# Patient Record
Sex: Male | Born: 1955
Health system: Southern US, Community
[De-identification: ages and names within clinical notes are randomized; demographics above are authoritative.]

---

## 1999-08-10 ENCOUNTER — Encounter: Payer: Self-pay | Admitting: Emergency Medicine

## 1999-08-10 ENCOUNTER — Emergency Department (HOSPITAL_COMMUNITY): Admission: EM | Admit: 1999-08-10 | Discharge: 1999-08-10 | Payer: Self-pay | Admitting: Emergency Medicine

## 2012-01-02 ENCOUNTER — Other Ambulatory Visit (HOSPITAL_COMMUNITY): Payer: Self-pay | Admitting: Family Medicine

## 2012-01-02 DIAGNOSIS — R748 Abnormal levels of other serum enzymes: Secondary | ICD-10-CM

## 2012-01-02 DIAGNOSIS — R7401 Elevation of levels of liver transaminase levels: Secondary | ICD-10-CM

## 2012-01-12 ENCOUNTER — Encounter (HOSPITAL_COMMUNITY)
Admission: RE | Admit: 2012-01-12 | Discharge: 2012-01-12 | Disposition: A | Discharge status: Home / Self Care | Payer: BC Managed Care – PPO | Source: Ambulatory Visit | Attending: Family Medicine | Admitting: Family Medicine

## 2012-01-12 DIAGNOSIS — R7401 Elevation of levels of liver transaminase levels: Secondary | ICD-10-CM | POA: Insufficient documentation

## 2012-01-12 DIAGNOSIS — R7402 Elevation of levels of lactic acid dehydrogenase (LDH): Secondary | ICD-10-CM | POA: Insufficient documentation

## 2012-01-12 DIAGNOSIS — R748 Abnormal levels of other serum enzymes: Secondary | ICD-10-CM | POA: Insufficient documentation

## 2012-01-12 MED ORDER — TECHNETIUM TC 99M MEDRONATE IV KIT
26.6000 | PACK | Freq: Once | INTRAVENOUS | Status: AC | PRN
  Administered 2012-01-12: 26.6 via INTRAVENOUS

## 2012-01-14 ENCOUNTER — Other Ambulatory Visit: Payer: Self-pay | Admitting: Family Medicine

## 2012-01-14 DIAGNOSIS — R7989 Other specified abnormal findings of blood chemistry: Secondary | ICD-10-CM

## 2012-01-14 DIAGNOSIS — R948 Abnormal results of function studies of other organs and systems: Secondary | ICD-10-CM

## 2012-01-15 ENCOUNTER — Ambulatory Visit
Admission: RE | Admit: 2012-01-15 | Discharge: 2012-01-15 | Disposition: A | Discharge status: Home / Self Care | Payer: BC Managed Care – PPO | Source: Ambulatory Visit | Attending: Family Medicine | Admitting: Family Medicine

## 2012-01-15 DIAGNOSIS — R7989 Other specified abnormal findings of blood chemistry: Secondary | ICD-10-CM

## 2012-01-15 DIAGNOSIS — R948 Abnormal results of function studies of other organs and systems: Secondary | ICD-10-CM

## 2012-01-15 MED ORDER — IOHEXOL 300 MG/ML  SOLN
100.0000 mL | Freq: Once | INTRAMUSCULAR | Status: AC | PRN
  Administered 2012-01-15: 100 mL via INTRAVENOUS

## 2013-03-17 IMAGING — CT CT ABD-PELV W/ CM
2 of 5 series · 16 of 46 positions shown, 18 images · IV contrast (READICAT/WATER & [ID] OMNI 300)
Comparison: Bone scan of 01/12/2012.  No prior CTs.

CLINICAL DATA: Elevated liver function tests.  Low back pain.  Bone
scan demonstrating findings suspicious for Paget's disease.

CT ABDOMEN AND PELVIS WITH CONTRAST
TECHNIQUE: Multidetector CT imaging of the abdomen and pelvis was
performed following the standard protocol during bolus
administration of intravenous contrast.
Contrast: 100mL OMNIPAQUE IOHEXOL 300 MG/ML  SOLN

[Series 2: abd/pelvis with · axial · 0.70mm/px · z∈[-386,+34]mm · 13 of 94 slices shown, 15 images]
[im 5/94  soft-tissue]
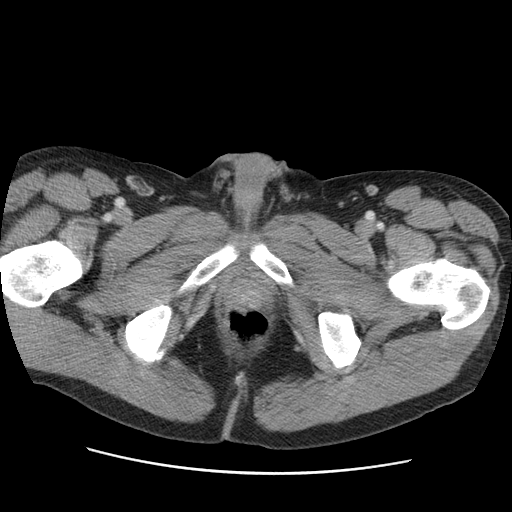
[im 5/94  bone]
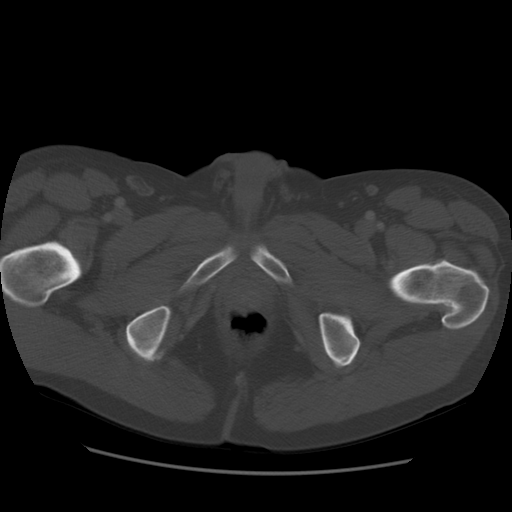
[im 15/94  soft-tissue]
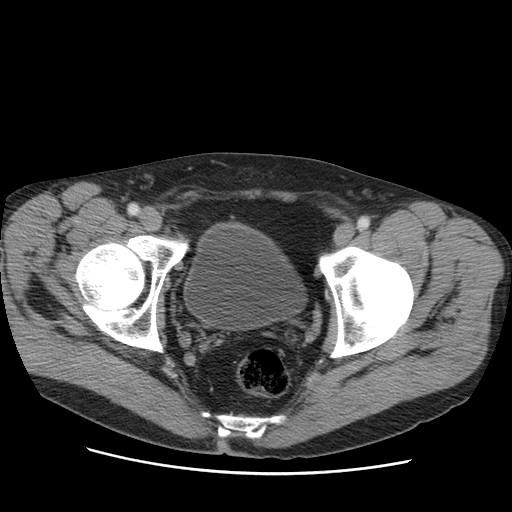
[im 20/94  soft-tissue]
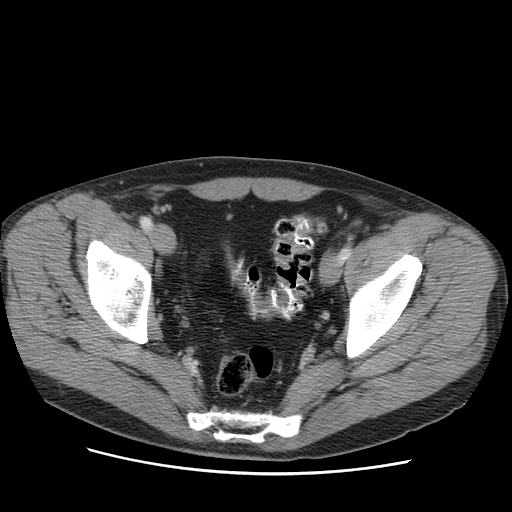
[im 25/94  soft-tissue]
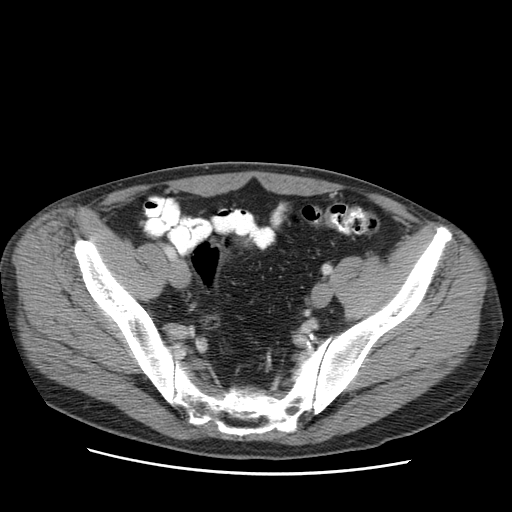
[im 35/94  soft-tissue]
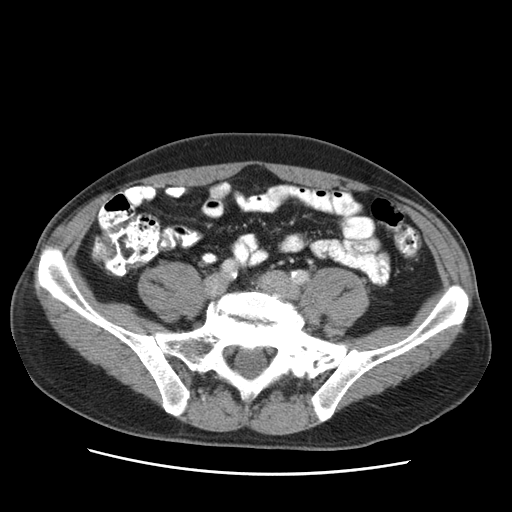
[im 40/94  soft-tissue]
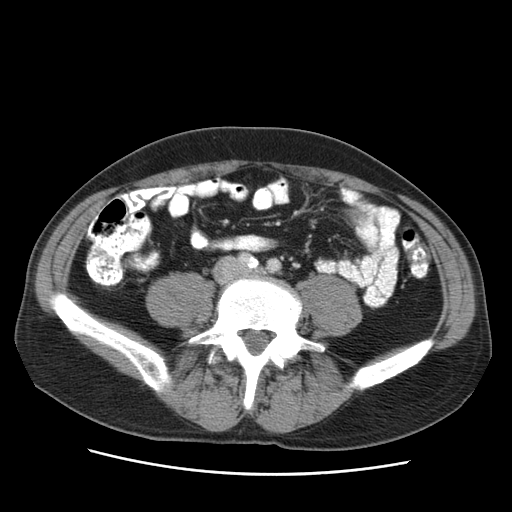
[im 49/94  soft-tissue]
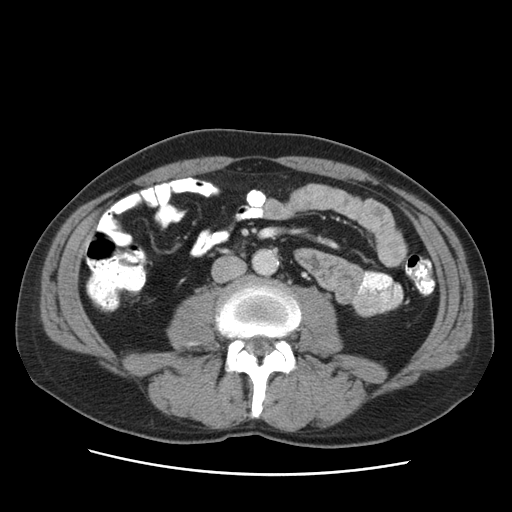
[im 54/94  soft-tissue]
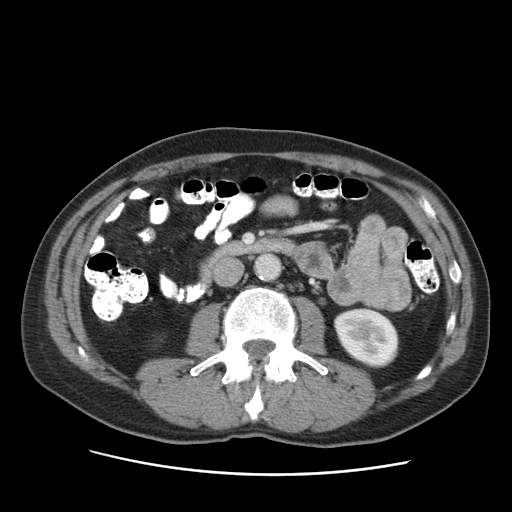
[im 59/94  soft-tissue]
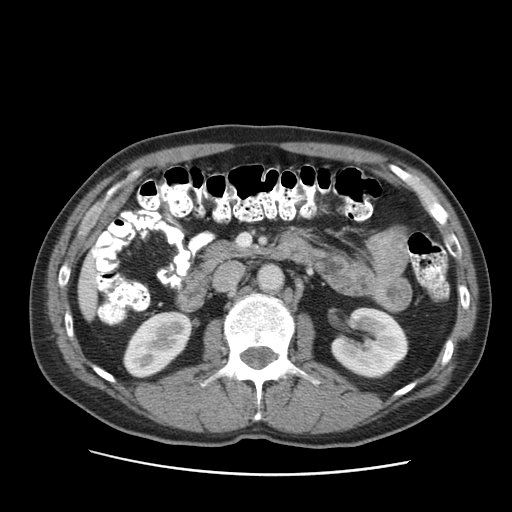
[im 59/94  bone]
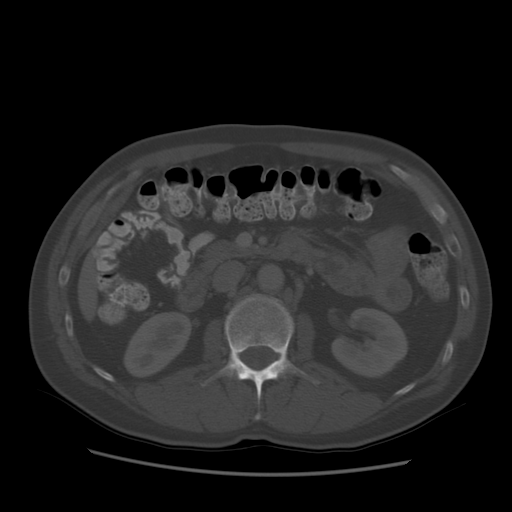
[im 69/94  soft-tissue]
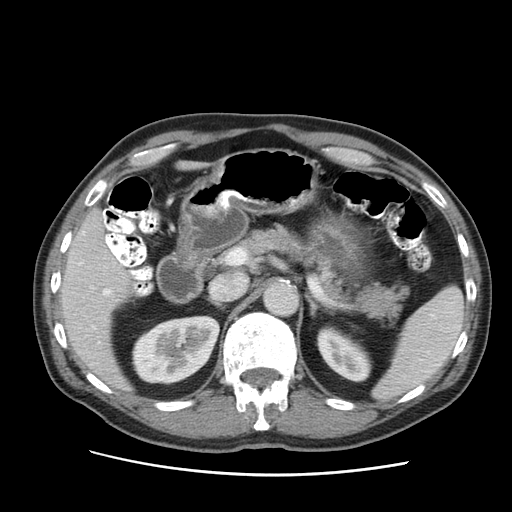
[im 74/94  soft-tissue]
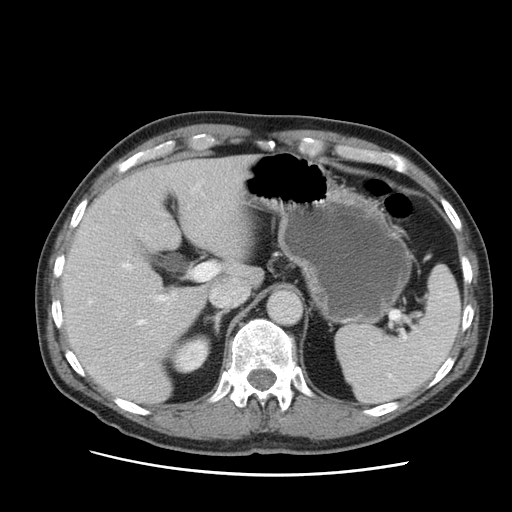
[im 79/94  soft-tissue]
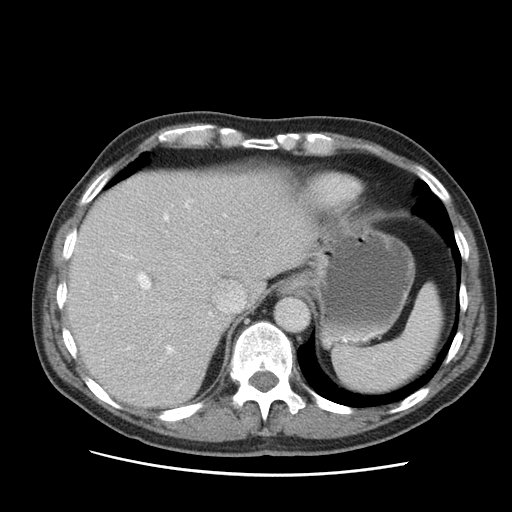
[im 89/94  soft-tissue]
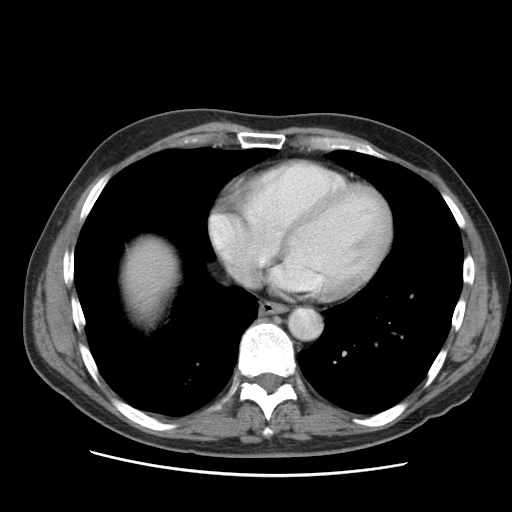

[Series 400: coronal · coronal · 0.97mm/px · 3 of 109 slices shown]
[im 37/109  soft-tissue]
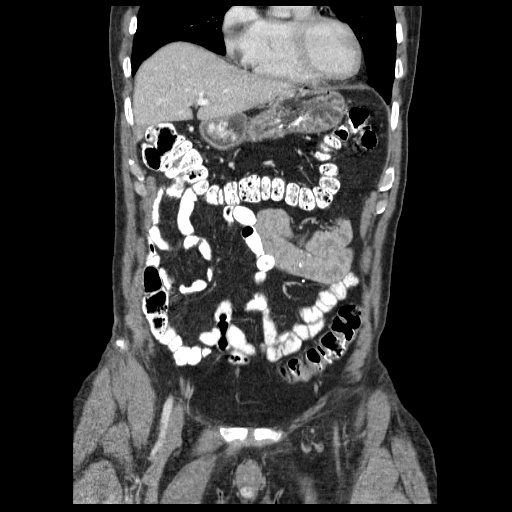
[im 49/109  soft-tissue]
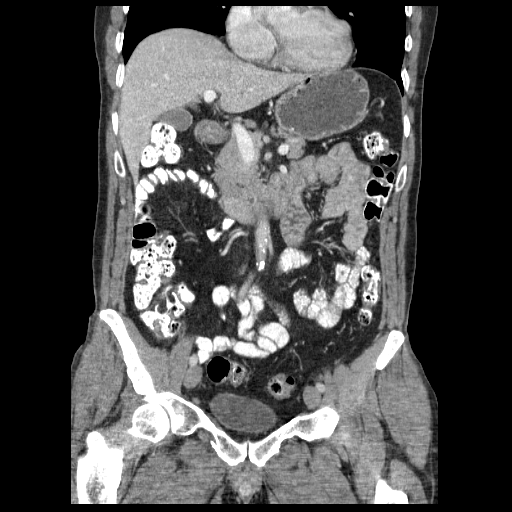
[im 61/109  soft-tissue]
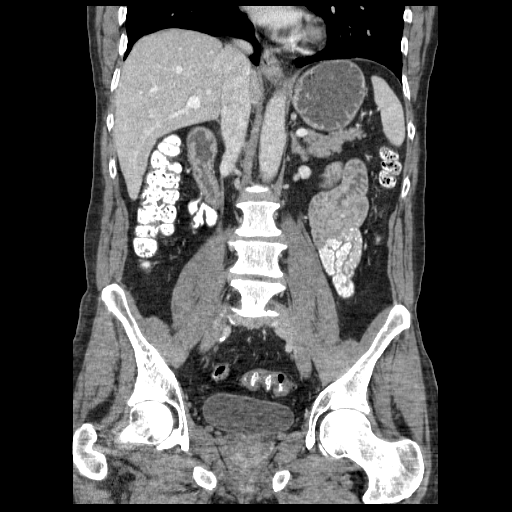

[16 of 46 positions shown; findings below may reference images not displayed]

FINDINGS: Clear lung bases.  Borderline cardiomegaly.  Coronary
artery atherosclerosis. No pericardial or pleural effusion.
Lateral segment left liver lobe 9 mm lesion is too small to
characterize.  Normal spleen, stomach, pancreas, gallbladder.  The
common duct measures up to 8 mm on coronal image 55.  No
obstructive stone or mass identified. Common duct tapers in the
region of  pancreatic head.  No pancreatic ductal dilatation.

No intrahepatic biliary ductal dilatation.  Normal adrenal glands
and kidneys.   No retroperitoneal or retrocrural adenopathy.

Normal colon, appendix, and terminal ileum.  Normal small bowel
without abdominal ascites.

  No pelvic adenopathy.    Normal urinary bladder and prostate.  No
significant free fluid.  A urachal remnant is incidentally noted on
image 57.  There is diffuse heterogeneous density with cortical
thickening throughout the entirety of the sacrum.  Cortical
thickening and heterogeneous density involving the L1 vertebral
body. This extends into the spinous process and posterior elements.
IMPRESSION: 1.  Cortical/trabecular thickening and heterogeneous density within
the sacrum and L1 vertebral bodies.  Most consistent with Paget's
disease.  No evidence of osseous metastasis.
2.  No evidence of primary malignancy within the abdomen or pelvis.
3.  Mild common duct dilatation for age.  No intrahepatic biliary
ductal dilatation.  No evidence of obstructive stone or mass.  The
clinical history describes "elevated liver function tests."  If the
pattern suggests biliary obstruction, MRCP or ERCP should be
considered.
4.  Coronary artery disease.

## 2015-10-01 DIAGNOSIS — M889 Osteitis deformans of unspecified bone: Secondary | ICD-10-CM | POA: Diagnosis not present

## 2015-10-01 DIAGNOSIS — N529 Male erectile dysfunction, unspecified: Secondary | ICD-10-CM | POA: Diagnosis not present

## 2015-10-01 DIAGNOSIS — Z125 Encounter for screening for malignant neoplasm of prostate: Secondary | ICD-10-CM | POA: Diagnosis not present

## 2015-10-01 DIAGNOSIS — Z23 Encounter for immunization: Secondary | ICD-10-CM | POA: Diagnosis not present

## 2015-10-01 DIAGNOSIS — E559 Vitamin D deficiency, unspecified: Secondary | ICD-10-CM | POA: Diagnosis not present

## 2016-06-07 ENCOUNTER — Ambulatory Visit (HOSPITAL_COMMUNITY)
Admission: EM | Admit: 2016-06-07 | Discharge: 2016-06-07 | Disposition: A | Discharge status: Home / Self Care | Payer: BLUE CROSS/BLUE SHIELD | Attending: Family Medicine | Admitting: Family Medicine

## 2016-06-07 ENCOUNTER — Encounter (HOSPITAL_COMMUNITY): Payer: Self-pay | Admitting: *Deleted

## 2016-06-07 DIAGNOSIS — J209 Acute bronchitis, unspecified: Secondary | ICD-10-CM

## 2016-06-07 DIAGNOSIS — R05 Cough: Secondary | ICD-10-CM | POA: Diagnosis not present

## 2016-06-07 DIAGNOSIS — R059 Cough, unspecified: Secondary | ICD-10-CM

## 2016-06-07 MED ORDER — IPRATROPIUM-ALBUTEROL 0.5-2.5 (3) MG/3ML IN SOLN
RESPIRATORY_TRACT | Status: AC
  Filled 2016-06-07: qty 3

## 2016-06-07 MED ORDER — PREDNISONE 10 MG PO TABS: 20.0000 mg | ORAL_TABLET | Freq: Every day | ORAL | 0 refills | Status: AC

## 2016-06-07 MED ORDER — ALBUTEROL SULFATE HFA 108 (90 BASE) MCG/ACT IN AERS: 2.0000 | INHALATION_SPRAY | RESPIRATORY_TRACT | 0 refills | Status: AC | PRN

## 2016-06-07 MED ORDER — IPRATROPIUM-ALBUTEROL 0.5-2.5 (3) MG/3ML IN SOLN
3.0000 mL | Freq: Once | RESPIRATORY_TRACT | Status: AC
  Administered 2016-06-07: 3 mL via RESPIRATORY_TRACT

## 2016-06-07 MED ORDER — AMOXICILLIN 875 MG PO TABS: 875.0000 mg | ORAL_TABLET | Freq: Two times a day (BID) | ORAL | 0 refills | Status: AC

## 2016-06-07 MED ORDER — AMOXICILLIN 875 MG PO TABS: 875.0000 mg | ORAL_TABLET | Freq: Two times a day (BID) | ORAL | 0 refills | Status: DC

## 2016-06-07 MED ORDER — PROMETHAZINE VC/CODEINE 6.25-5-10 MG/5ML PO SYRP: 5.0000 mL | ORAL_SOLUTION | Freq: Four times a day (QID) | ORAL | 0 refills | Status: AC | PRN

## 2016-06-07 NOTE — ED Triage Notes (Signed)
Pt  Reports   Several  Days  Of  Cough      Congested            Body  Aches   As   Well   As  Runny  Nose   And        Congestion  Pt  States  He  Works  Outside   In the  [employer]      He  Is  A  Smoker

## 2016-06-08 NOTE — ED Provider Notes (Signed)
CSN: [id number]     Arrival date & time 06/07/16  1816 History   First MD Initiated Contact with Patient 06/07/16 1845     Chief Complaint  Patient presents with  . Cough   (Consider location/radiation/quality/duration/timing/severity/associated sxs/prior Treatment) Patient c/o cough congestion over a week.  Patient is long time cigarette smoker.   The history is provided by the patient.  Cough  Cough characteristics:  Productive Sputum characteristics:  Yellow Severity:  Moderate Onset quality:  Sudden Duration:  1 week Timing:  Constant Progression:  Worsening Chronicity:  New Smoker: yes   Context: smoke exposure, upper respiratory infection and weather changes   Relieved by:  Nothing Worsened by:  Activity, deep breathing, exposure to cold air and smoking Associated symptoms: chest pain, chills, rhinorrhea, shortness of breath, sinus congestion, sore throat and wheezing     History reviewed. No pertinent past medical history. History reviewed. No pertinent surgical history. History reviewed. No pertinent family history. Social History  Substance Use Topics  . Smoking status: Current Every Day Smoker  . Smokeless tobacco: Never Used  . Alcohol use No    Review of Systems  Constitutional: Positive for chills.  HENT: Positive for rhinorrhea and sore throat.   Eyes: Negative.   Respiratory: Positive for cough, shortness of breath and wheezing.   Cardiovascular: Positive for chest pain.  Gastrointestinal: Negative.   Endocrine: Negative.   Genitourinary: Negative.   Musculoskeletal: Negative.   Skin: Negative.   Allergic/Immunologic: Negative.   Neurological: Negative.   Hematological: Negative.   Psychiatric/Behavioral: Negative.     Allergies  Patient has no known allergies.  Home Medications   Prior to Admission medications   Medication Sig Start Date End Date Taking? Authorizing Provider  albuterol (PROVENTIL HFA;VENTOLIN HFA) 108 (90 Base) MCG/ACT  inhaler Inhale 2 puffs into the lungs every 4 (four) hours as needed for wheezing or shortness of breath. 06/07/16   Deatra CanterWilliam J Carolena Fairbank, FNP  amoxicillin (AMOXIL) 875 MG tablet Take 1 tablet (875 mg total) by mouth 2 (two) times daily. 06/07/16   Deatra CanterWilliam J Jovan Schickling, FNP  predniSONE (DELTASONE) 10 MG tablet Take 2 tablets (20 mg total) by mouth daily. 06/07/16   Deatra CanterWilliam J Tearah Saulsbury, FNP  Promethazine-Phenyleph-Codeine (PROMETHAZINE VC/CODEINE) 6.25-5-10 MG/5ML SYRP Take 5 mLs by mouth 4 (four) times daily as needed. 06/07/16   Deatra CanterWilliam J Laurelle Skiver, FNP   Meds Ordered and Administered this Visit   Medications  ipratropium-albuterol (DUONEB) 0.5-2.5 (3) MG/3ML nebulizer solution 3 mL (3 mLs Nebulization Given 06/07/16 1852)    BP 110/72 (BP Location: Right Arm)   Pulse 78   Temp 98.6 F (37 C) (Oral)   Resp 18   SpO2 100%  No data found.   Physical Exam  Constitutional: He appears well-developed and well-nourished.  HENT:  Head: Normocephalic and atraumatic.  Right Ear: External ear normal.  Left Ear: External ear normal.  Mouth/Throat: Oropharynx is clear and moist.  Eyes: Conjunctivae and EOM are normal. Pupils are equal, round, and reactive to light.  Neck: Normal range of motion. Neck supple.  Cardiovascular: Normal rate, regular rhythm and normal heart sounds.   Pulmonary/Chest: Effort normal and breath sounds normal.  Nursing note and vitals reviewed.   Urgent Care Course   Clinical Course     Procedures (including critical care time)  Labs Review Labs Reviewed - No data to display  Imaging Review No results found.   Visual Acuity Review  Right Eye Distance:   Left Eye Distance:  Bilateral Distance:    Right Eye Near:   Left Eye Near:    Bilateral Near:         MDM   1. Cough   2. Acute bronchitis, unspecified organism    Neb treatment Prednisone 20mg  po x 5 days Amoxicillin 875mg  one po bid x 7 days #14 Promethazine with codiene cough syrup 5ml po qid prn  #158ml Albuterol MDI 2 puffs q 4 hours prn  Push po fluids, rest, tylenol and motrin otc prn as directed for fever, arthralgias, and myalgias.  Follow up prn if sx's continue or persist.    Deatra Canter, FNP 06/08/16 1210

## 2016-10-06 ENCOUNTER — Ambulatory Visit
Admission: RE | Admit: 2016-10-06 | Discharge: 2016-10-06 | Disposition: A | Discharge status: Home / Self Care | Payer: BLUE CROSS/BLUE SHIELD | Source: Ambulatory Visit | Attending: Family Medicine | Admitting: Family Medicine

## 2016-10-06 ENCOUNTER — Other Ambulatory Visit: Payer: Self-pay | Admitting: Family Medicine

## 2016-10-06 DIAGNOSIS — R634 Abnormal weight loss: Secondary | ICD-10-CM | POA: Diagnosis not present

## 2016-10-06 DIAGNOSIS — Z0000 Encounter for general adult medical examination without abnormal findings: Secondary | ICD-10-CM | POA: Diagnosis not present

## 2016-10-06 DIAGNOSIS — E559 Vitamin D deficiency, unspecified: Secondary | ICD-10-CM | POA: Diagnosis not present

## 2016-10-06 DIAGNOSIS — J439 Emphysema, unspecified: Secondary | ICD-10-CM | POA: Diagnosis not present

## 2017-10-07 DIAGNOSIS — N529 Male erectile dysfunction, unspecified: Secondary | ICD-10-CM | POA: Diagnosis not present

## 2017-10-07 DIAGNOSIS — M889 Osteitis deformans of unspecified bone: Secondary | ICD-10-CM | POA: Diagnosis not present

## 2017-10-07 DIAGNOSIS — E559 Vitamin D deficiency, unspecified: Secondary | ICD-10-CM | POA: Diagnosis not present

## 2017-10-07 DIAGNOSIS — Z0000 Encounter for general adult medical examination without abnormal findings: Secondary | ICD-10-CM | POA: Diagnosis not present

## 2017-12-07 IMAGING — DX DG CHEST 2V
2 series · 2 of 2 positions shown · non-contrast
Comparison: None.

CLINICAL DATA: Weight loss, smoker.

EXAM:
CHEST  2 VIEW

[dg chest 2 view (1 of 2)]
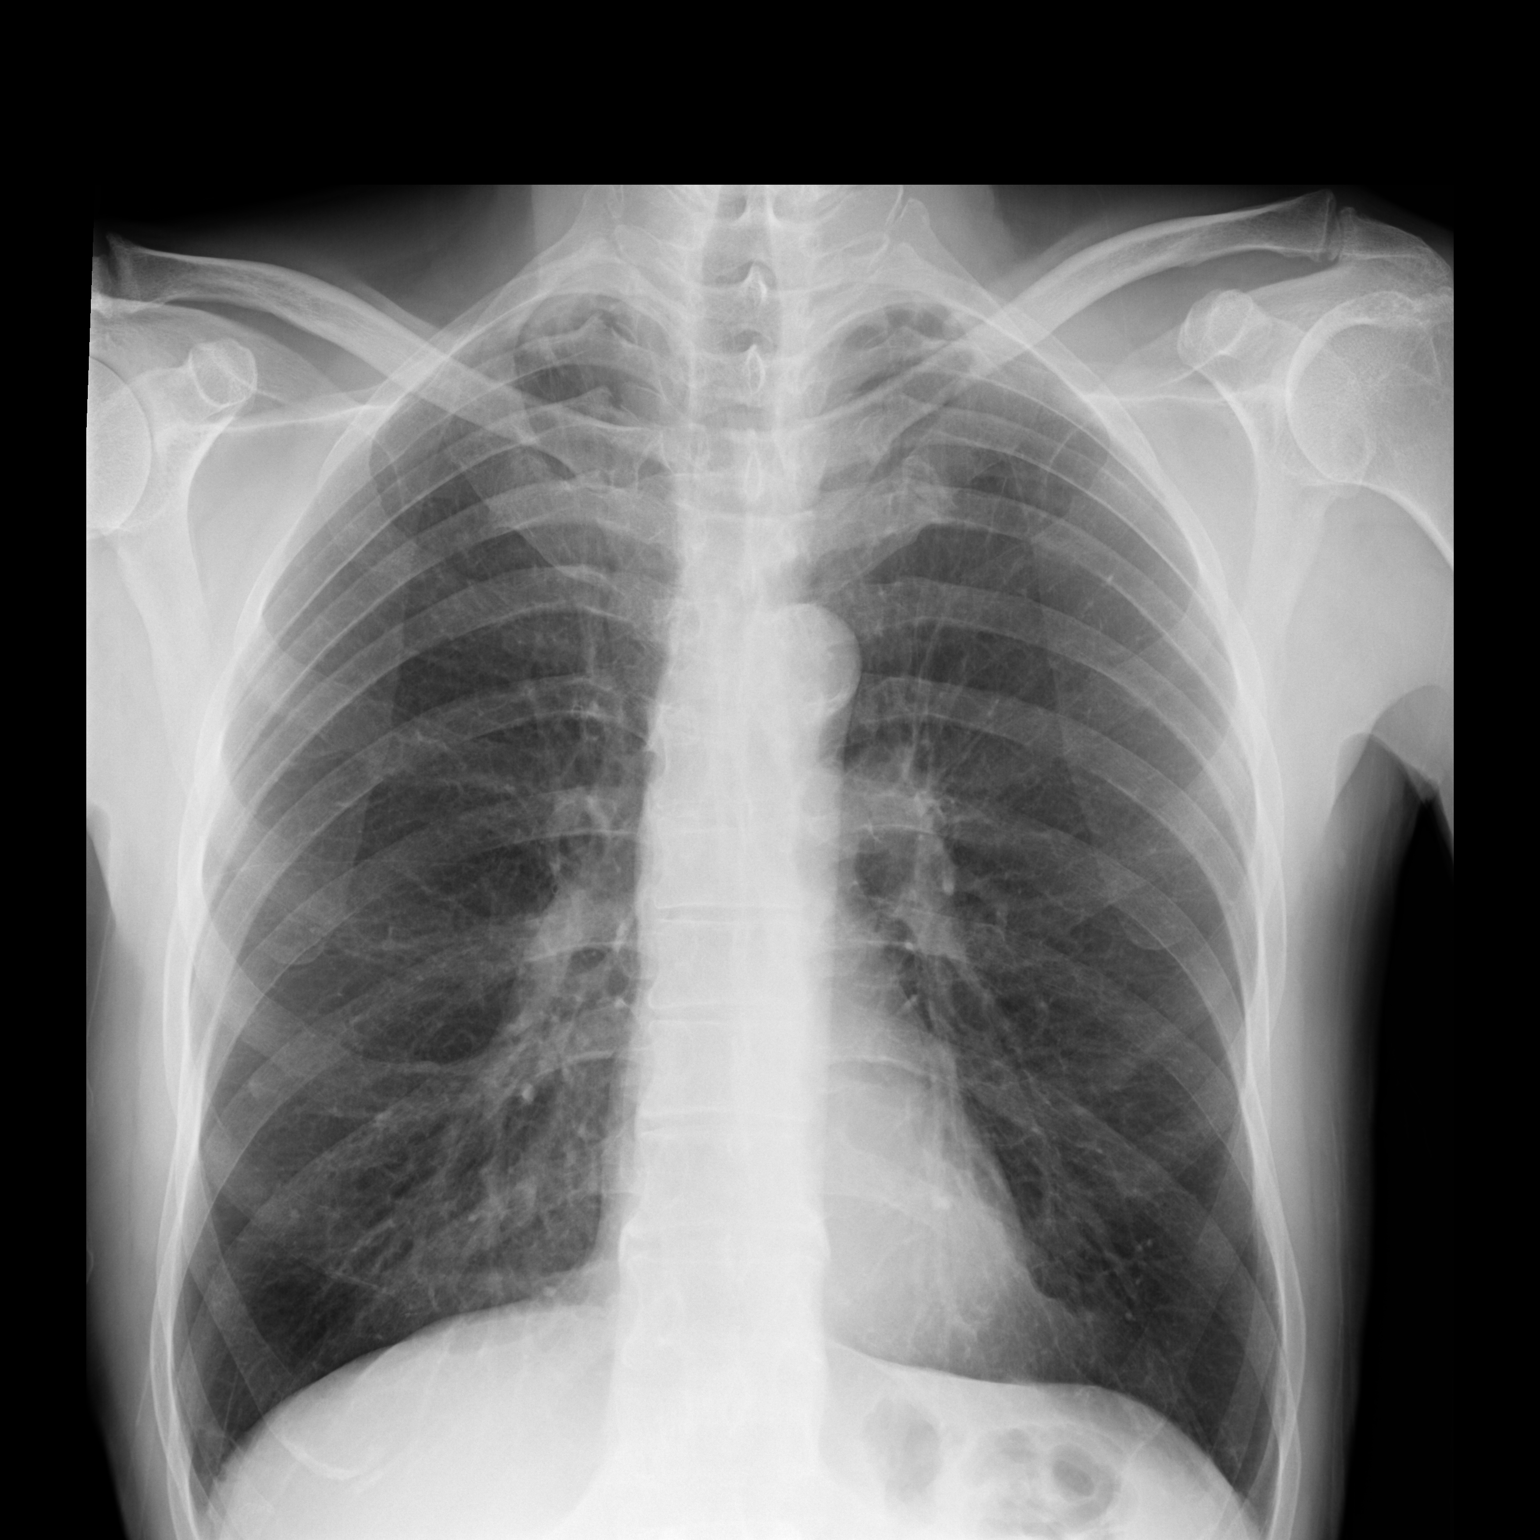

[dg chest 2 view (2 of 2)]
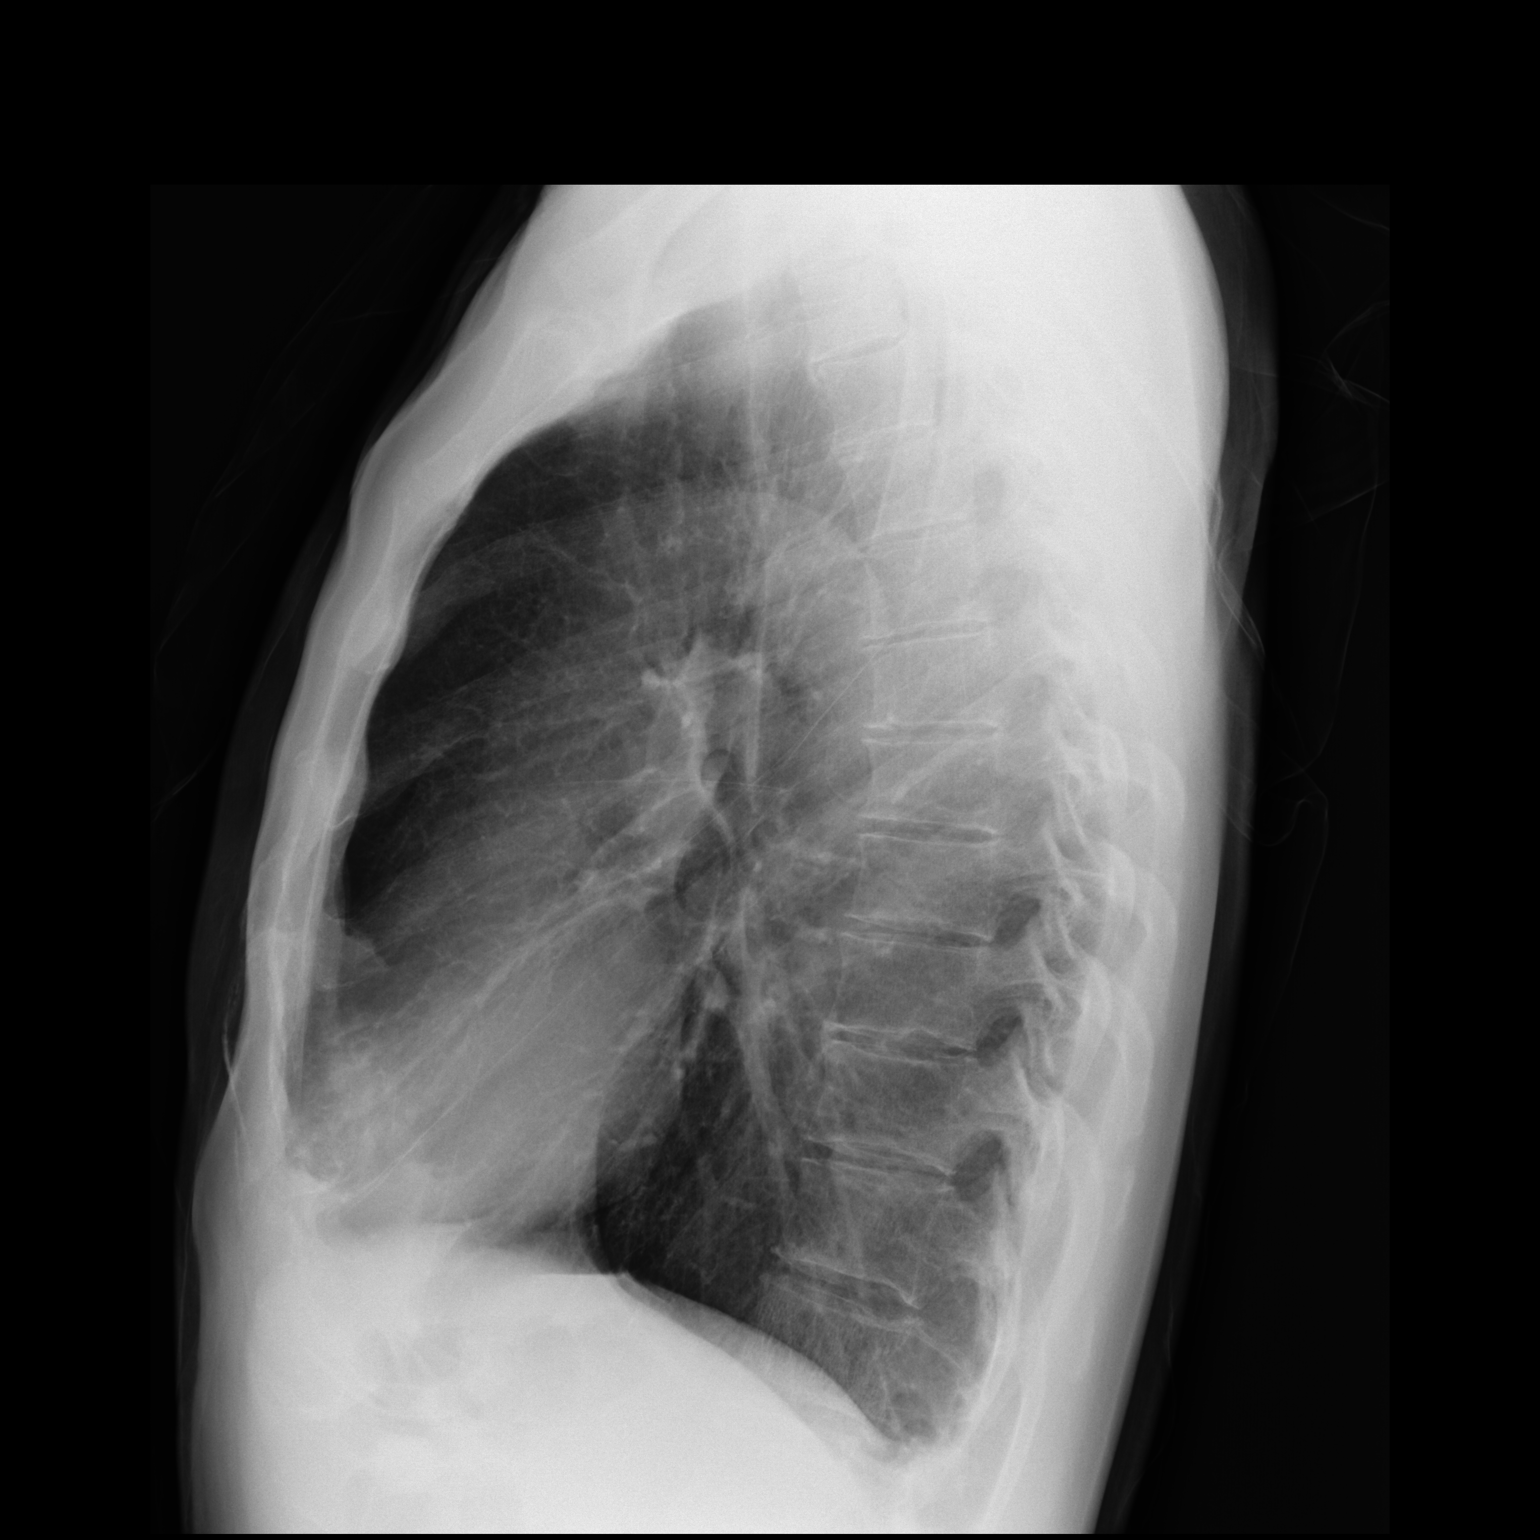

[2 of 2 positions shown; findings below may reference images not displayed]

FINDINGS: The lungs are hyperinflated with mild emphysema. There is biapical
pleuroparenchymal scarring. Normal heart size and mediastinal
contours. Atherosclerosis of the aortic arch. No pulmonary mass or
radiographically evident nodule. No consolidation, pulmonary edema,
pleural fluid or pneumothorax. No acute osseous abnormalities are
seen.
IMPRESSION: Hyperinflation with mild emphysema. Biapical pleural-parenchymal
scarring.

No pulmonary mass or findings suspicious for malignancy.

## 2018-11-03 DIAGNOSIS — Z0000 Encounter for general adult medical examination without abnormal findings: Secondary | ICD-10-CM | POA: Diagnosis not present

## 2018-11-03 DIAGNOSIS — Z13220 Encounter for screening for lipoid disorders: Secondary | ICD-10-CM | POA: Diagnosis not present

## 2018-11-03 DIAGNOSIS — Z125 Encounter for screening for malignant neoplasm of prostate: Secondary | ICD-10-CM | POA: Diagnosis not present

## 2018-11-03 DIAGNOSIS — Z131 Encounter for screening for diabetes mellitus: Secondary | ICD-10-CM | POA: Diagnosis not present

## 2019-11-09 DIAGNOSIS — Z131 Encounter for screening for diabetes mellitus: Secondary | ICD-10-CM | POA: Diagnosis not present

## 2019-11-09 DIAGNOSIS — Z13220 Encounter for screening for lipoid disorders: Secondary | ICD-10-CM | POA: Diagnosis not present

## 2019-11-09 DIAGNOSIS — Z0000 Encounter for general adult medical examination without abnormal findings: Secondary | ICD-10-CM | POA: Diagnosis not present

## 2019-12-06 DIAGNOSIS — R7301 Impaired fasting glucose: Secondary | ICD-10-CM | POA: Diagnosis not present

## 2020-02-16 DIAGNOSIS — L089 Local infection of the skin and subcutaneous tissue, unspecified: Secondary | ICD-10-CM | POA: Diagnosis not present

## 2020-11-09 DIAGNOSIS — N529 Male erectile dysfunction, unspecified: Secondary | ICD-10-CM | POA: Diagnosis not present

## 2020-11-09 DIAGNOSIS — J439 Emphysema, unspecified: Secondary | ICD-10-CM | POA: Diagnosis not present

## 2020-11-09 DIAGNOSIS — M889 Osteitis deformans of unspecified bone: Secondary | ICD-10-CM | POA: Diagnosis not present

## 2020-11-09 DIAGNOSIS — Z23 Encounter for immunization: Secondary | ICD-10-CM | POA: Diagnosis not present

## 2020-11-09 DIAGNOSIS — Z136 Encounter for screening for cardiovascular disorders: Secondary | ICD-10-CM | POA: Diagnosis not present

## 2020-11-09 DIAGNOSIS — E559 Vitamin D deficiency, unspecified: Secondary | ICD-10-CM | POA: Diagnosis not present

## 2020-11-09 DIAGNOSIS — M545 Low back pain, unspecified: Secondary | ICD-10-CM | POA: Diagnosis not present

## 2020-11-09 DIAGNOSIS — Z125 Encounter for screening for malignant neoplasm of prostate: Secondary | ICD-10-CM | POA: Diagnosis not present

## 2020-11-09 DIAGNOSIS — Z13220 Encounter for screening for lipoid disorders: Secondary | ICD-10-CM | POA: Diagnosis not present

## 2020-11-09 DIAGNOSIS — N486 Induration penis plastica: Secondary | ICD-10-CM | POA: Diagnosis not present

## 2020-11-09 DIAGNOSIS — Z0000 Encounter for general adult medical examination without abnormal findings: Secondary | ICD-10-CM | POA: Diagnosis not present

## 2020-11-09 DIAGNOSIS — Z1159 Encounter for screening for other viral diseases: Secondary | ICD-10-CM | POA: Diagnosis not present

## 2020-11-09 DIAGNOSIS — R7303 Prediabetes: Secondary | ICD-10-CM | POA: Diagnosis not present

## 2020-11-19 ENCOUNTER — Encounter: Payer: Self-pay | Admitting: Podiatry

## 2020-11-19 ENCOUNTER — Other Ambulatory Visit: Payer: Self-pay

## 2020-11-19 ENCOUNTER — Ambulatory Visit: Payer: Medicare HMO | Admitting: Podiatry

## 2020-11-19 DIAGNOSIS — Q828 Other specified congenital malformations of skin: Secondary | ICD-10-CM

## 2020-11-19 DIAGNOSIS — L84 Corns and callosities: Secondary | ICD-10-CM

## 2020-11-19 DIAGNOSIS — K219 Gastro-esophageal reflux disease without esophagitis: Secondary | ICD-10-CM | POA: Insufficient documentation

## 2020-11-19 DIAGNOSIS — K449 Diaphragmatic hernia without obstruction or gangrene: Secondary | ICD-10-CM | POA: Insufficient documentation

## 2020-11-19 DIAGNOSIS — E559 Vitamin D deficiency, unspecified: Secondary | ICD-10-CM | POA: Insufficient documentation

## 2020-11-19 DIAGNOSIS — N486 Induration penis plastica: Secondary | ICD-10-CM | POA: Insufficient documentation

## 2020-11-19 DIAGNOSIS — B070 Plantar wart: Secondary | ICD-10-CM | POA: Diagnosis not present

## 2020-11-19 DIAGNOSIS — Z22322 Carrier or suspected carrier of Methicillin resistant Staphylococcus aureus: Secondary | ICD-10-CM | POA: Insufficient documentation

## 2020-11-19 DIAGNOSIS — M889 Osteitis deformans of unspecified bone: Secondary | ICD-10-CM | POA: Insufficient documentation

## 2020-11-19 DIAGNOSIS — N529 Male erectile dysfunction, unspecified: Secondary | ICD-10-CM | POA: Insufficient documentation

## 2020-11-19 DIAGNOSIS — F17201 Nicotine dependence, unspecified, in remission: Secondary | ICD-10-CM | POA: Insufficient documentation

## 2020-11-19 DIAGNOSIS — J439 Emphysema, unspecified: Secondary | ICD-10-CM | POA: Insufficient documentation

## 2020-11-19 NOTE — Progress Notes (Signed)
Subjective:   Patient ID: Shawn Ponce, male   DOB: 65 y.o.   MRN: [id number]   HPI Patient presents stating he stepped on a nail around 6 months ago and has had near occlusion bottom of his foot with some form of lesion formation that he cannot control himself.  States he gets sore when he tries to walk and he needs to be active and patient does not smoke approximate half pack per day and tries to be active   Review of Systems  All other systems reviewed and are negative.      Objective:  Physical Exam Vitals and nursing note reviewed.  Constitutional:      Appearance: He is well-developed.  Pulmonary:     Effort: Pulmonary effort is normal.  Musculoskeletal:        General: Normal range of motion.  Skin:    General: Skin is warm.  Neurological:     Mental Status: He is alert.    Neurovascular status found to be intact muscle strength found to be adequate range of motion found to be adequate.  Patient is noted to have lesion plantar aspect right forefoot measuring around 5 x 5 mm it is painful to lateral pressure and has a hyper lucent core formation     Assessment:  Possibility that this is a porokeratosis versus possibility that this may be a verruca plantaris versus foreign body epidermal cyst     Plan:  H&P reviewed condition at great length discussed all these different possibilities for this and at this point deep debridement accomplished applied chemical agent to soften it and advised on offloading.  Reappoint as symptoms indicate may require other types of work over the long-term

## 2021-03-29 DIAGNOSIS — Z008 Encounter for other general examination: Secondary | ICD-10-CM | POA: Diagnosis not present

## 2021-03-29 DIAGNOSIS — Z720 Tobacco use: Secondary | ICD-10-CM | POA: Diagnosis not present

## 2021-03-29 DIAGNOSIS — N529 Male erectile dysfunction, unspecified: Secondary | ICD-10-CM | POA: Diagnosis not present

## 2021-03-29 DIAGNOSIS — Z8249 Family history of ischemic heart disease and other diseases of the circulatory system: Secondary | ICD-10-CM | POA: Diagnosis not present

## 2021-11-22 DIAGNOSIS — N529 Male erectile dysfunction, unspecified: Secondary | ICD-10-CM | POA: Diagnosis not present

## 2021-11-22 DIAGNOSIS — Z833 Family history of diabetes mellitus: Secondary | ICD-10-CM | POA: Diagnosis not present

## 2021-11-22 DIAGNOSIS — Z8249 Family history of ischemic heart disease and other diseases of the circulatory system: Secondary | ICD-10-CM | POA: Diagnosis not present

## 2021-11-22 DIAGNOSIS — Z720 Tobacco use: Secondary | ICD-10-CM | POA: Diagnosis not present

## 2021-11-22 DIAGNOSIS — R030 Elevated blood-pressure reading, without diagnosis of hypertension: Secondary | ICD-10-CM | POA: Diagnosis not present

## 2021-12-06 DIAGNOSIS — E559 Vitamin D deficiency, unspecified: Secondary | ICD-10-CM | POA: Diagnosis not present

## 2021-12-06 DIAGNOSIS — Z13220 Encounter for screening for lipoid disorders: Secondary | ICD-10-CM | POA: Diagnosis not present

## 2021-12-06 DIAGNOSIS — Z125 Encounter for screening for malignant neoplasm of prostate: Secondary | ICD-10-CM | POA: Diagnosis not present

## 2021-12-06 DIAGNOSIS — R7303 Prediabetes: Secondary | ICD-10-CM | POA: Diagnosis not present

## 2021-12-06 DIAGNOSIS — M889 Osteitis deformans of unspecified bone: Secondary | ICD-10-CM | POA: Diagnosis not present

## 2021-12-06 DIAGNOSIS — Z1211 Encounter for screening for malignant neoplasm of colon: Secondary | ICD-10-CM | POA: Diagnosis not present

## 2021-12-06 DIAGNOSIS — Z0000 Encounter for general adult medical examination without abnormal findings: Secondary | ICD-10-CM | POA: Diagnosis not present

## 2021-12-06 DIAGNOSIS — N486 Induration penis plastica: Secondary | ICD-10-CM | POA: Diagnosis not present

## 2021-12-06 DIAGNOSIS — N529 Male erectile dysfunction, unspecified: Secondary | ICD-10-CM | POA: Diagnosis not present

## 2021-12-06 DIAGNOSIS — J439 Emphysema, unspecified: Secondary | ICD-10-CM | POA: Diagnosis not present

## 2021-12-06 DIAGNOSIS — M545 Low back pain, unspecified: Secondary | ICD-10-CM | POA: Diagnosis not present

## 2022-06-20 DIAGNOSIS — D123 Benign neoplasm of transverse colon: Secondary | ICD-10-CM | POA: Diagnosis not present

## 2022-06-20 DIAGNOSIS — K5730 Diverticulosis of large intestine without perforation or abscess without bleeding: Secondary | ICD-10-CM | POA: Diagnosis not present

## 2022-06-20 DIAGNOSIS — K648 Other hemorrhoids: Secondary | ICD-10-CM | POA: Diagnosis not present

## 2022-06-20 DIAGNOSIS — Z1211 Encounter for screening for malignant neoplasm of colon: Secondary | ICD-10-CM | POA: Diagnosis not present

## 2022-06-24 DIAGNOSIS — D123 Benign neoplasm of transverse colon: Secondary | ICD-10-CM | POA: Diagnosis not present

## 2023-01-09 DIAGNOSIS — M545 Low back pain, unspecified: Secondary | ICD-10-CM | POA: Diagnosis not present

## 2023-01-09 DIAGNOSIS — J439 Emphysema, unspecified: Secondary | ICD-10-CM | POA: Diagnosis not present

## 2023-01-09 DIAGNOSIS — N486 Induration penis plastica: Secondary | ICD-10-CM | POA: Diagnosis not present

## 2023-01-09 DIAGNOSIS — N529 Male erectile dysfunction, unspecified: Secondary | ICD-10-CM | POA: Diagnosis not present

## 2023-01-09 DIAGNOSIS — M889 Osteitis deformans of unspecified bone: Secondary | ICD-10-CM | POA: Diagnosis not present

## 2023-01-09 DIAGNOSIS — E559 Vitamin D deficiency, unspecified: Secondary | ICD-10-CM | POA: Diagnosis not present

## 2023-01-09 DIAGNOSIS — E785 Hyperlipidemia, unspecified: Secondary | ICD-10-CM | POA: Diagnosis not present

## 2023-01-09 DIAGNOSIS — Z0000 Encounter for general adult medical examination without abnormal findings: Secondary | ICD-10-CM | POA: Diagnosis not present

## 2023-01-09 DIAGNOSIS — F17200 Nicotine dependence, unspecified, uncomplicated: Secondary | ICD-10-CM | POA: Diagnosis not present

## 2023-01-09 DIAGNOSIS — Z1331 Encounter for screening for depression: Secondary | ICD-10-CM | POA: Diagnosis not present

## 2023-01-09 DIAGNOSIS — R7303 Prediabetes: Secondary | ICD-10-CM | POA: Diagnosis not present

## 2023-01-26 DIAGNOSIS — Z791 Long term (current) use of non-steroidal anti-inflammatories (NSAID): Secondary | ICD-10-CM | POA: Diagnosis not present

## 2023-01-26 DIAGNOSIS — N529 Male erectile dysfunction, unspecified: Secondary | ICD-10-CM | POA: Diagnosis not present

## 2023-01-26 DIAGNOSIS — Z008 Encounter for other general examination: Secondary | ICD-10-CM | POA: Diagnosis not present

## 2023-01-26 DIAGNOSIS — Z720 Tobacco use: Secondary | ICD-10-CM | POA: Diagnosis not present

## 2023-01-26 DIAGNOSIS — J449 Chronic obstructive pulmonary disease, unspecified: Secondary | ICD-10-CM | POA: Diagnosis not present

## 2024-02-03 ENCOUNTER — Telehealth: Payer: Self-pay | Admitting: Acute Care

## 2024-02-03 DIAGNOSIS — Z87891 Personal history of nicotine dependence: Secondary | ICD-10-CM

## 2024-02-03 DIAGNOSIS — Z122 Encounter for screening for malignant neoplasm of respiratory organs: Secondary | ICD-10-CM

## 2024-02-03 NOTE — Telephone Encounter (Signed)
 Lung Cancer Screening Narrative/Criteria Questionnaire (Cigarette Smokers Only- No Cigars/Pipes/vapes)   Shawn Ponce   SDMV:02/08/2024 12:00 [name]        1956-03-07   LDCT: Patient will call back to schedule LDCT as he is currently sick    68 y.o.   Phone: [phone]  Lung Screening Narrative (confirm age 34-77 yrs Medicare / 50-80 yrs Private pay insurance)   Insurance information:HTA and mcr, ID # are in LDCT order and LCS referral   Referring Provider:Dr. Auston   This screening involves an initial phone call with a team member from our program. It is called a shared decision making visit. The initial meeting is required by  insurance and Medicare to make sure you understand the program. This appointment takes about 15-20 minutes to complete. You will complete the screening scan at your scheduled date/time.  This scan takes about 5-10 minutes to complete. You can eat and drink normally before and after the scan.  Criteria questions for Lung Cancer Screening:   Are you a current or former smoker? Former Age began smoking: 68yo   If you are a former smoker, what year did you quit smoking? Most recent quit date is 11/2023. Patient also quit several other times totaling 4 years, each time he started back again(within 15 yrs)   To calculate your smoking history, I need an accurate estimate of how many packs of cigarettes you smoked per day and for how many years. (Not just the number of PPD you are now smoking)   Years smoking 44 x Packs per day 3/4 = Pack years 33   (at least 20 pack yrs)   (Make sure they understand that we need to know how much they have smoked in the past, not just the number of PPD they are smoking now)  Do you have a personal history of cancer?  No    Do you have a family history of cancer? No  Are you coughing up blood?  No  Have you had unexplained weight loss of 15 lbs or more in the last 6 months? No  It looks like you meet all criteria.  When  would be a good time for us  to schedule you for this screening?   Additional information: N/A

## 2024-02-08 ENCOUNTER — Ambulatory Visit (INDEPENDENT_AMBULATORY_CARE_PROVIDER_SITE_OTHER): Payer: Self-pay | Admitting: *Deleted

## 2024-02-08 DIAGNOSIS — Z87891 Personal history of nicotine dependence: Secondary | ICD-10-CM

## 2024-02-08 NOTE — Progress Notes (Signed)
 Virtual Visit via Telephone Note  I connected with Shawn Ponce on 02/08/24 at 12:00 PM EDT by telephone and verified that I am speaking with the correct person using two identifiers.  Location: Patient: Shawn Ponce Provider: [name] Speaks, RN   I discussed the limitations, risks, security and privacy concerns of performing an evaluation and management service by telephone and the availability of in person appointments. I also discussed with the patient that there may be a patient responsible charge related to this service. The patient expressed understanding and agreed to proceed.   Shared Decision Making Visit Lung Cancer Screening Program [phone])   Eligibility: Age 68 y.o. Pack Years Smoking History Calculation 37 (# packs/per year x # years smoked) Recent History of coughing up blood  no Unexplained weight loss? no ( >Than 15 pounds within the last 6 months ) Prior History Lung / other cancer no (Diagnosis within the last 5 years already requiring surveillance chest CT Scans). Smoking Status Former Smoker Former Smokers: Years since quit: < 1 year  Quit Date: 11/2023  Visit Components: Discussion included one or more decision making aids. yes Discussion included risk/benefits of screening. yes Discussion included potential follow up diagnostic testing for abnormal scans. yes Discussion included meaning and risk of over diagnosis. yes Discussion included meaning and risk of False Positives. yes Discussion included meaning of total radiation exposure. yes  Counseling Included: Importance of adherence to annual lung cancer LDCT screening. yes Impact of comorbidities on ability to participate in the program. yes Ability and willingness to under diagnostic treatment. yes  Smoking Cessation Counseling: Current Smokers:  Discussed importance of smoking cessation. yes Information about tobacco cessation classes and interventions provided to patient. yes Patient  provided with ticket for LDCT Scan. no Symptomatic Patient. no  Counseling(Intermediate counseling: > three minutes) 99406 Diagnosis Code: Tobacco Use Z72.0 Asymptomatic Patient yes   Counseled patient 4 minutes regarding tobacco use.   Former Smokers:  Discussed the importance of maintaining cigarette abstinence. yes Diagnosis Code: Personal History of Nicotine Dependence. S12.108 Information about tobacco cessation classes and interventions provided to patient. Yes Patient provided with ticket for LDCT Scan. no Written Order for Lung Cancer Screening with LDCT placed in Epic. Yes (CT Chest Lung Cancer Screening Low Dose W/O CM) [id number] Z12.2-Screening of respiratory organs Z87.891-Personal history of nicotine dependence   [name] Speaks, RN

## 2024-02-08 NOTE — Patient Instructions (Signed)
